# Patient Record
Sex: Male | Born: 1990 | Race: White | Hispanic: No | Marital: Single | Smoking: Never smoker
Health system: Southern US, Community
[De-identification: ages and names within clinical notes are randomized; demographics above are authoritative.]

---
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
## 2009-05-03 ENCOUNTER — Emergency Department: Payer: Self-pay | Admitting: Emergency Medicine

## 2010-04-05 ENCOUNTER — Emergency Department: Payer: Self-pay | Admitting: Emergency Medicine

## 2010-07-05 ENCOUNTER — Emergency Department: Payer: Self-pay | Admitting: Emergency Medicine

## 2014-05-19 ENCOUNTER — Emergency Department: Payer: Self-pay | Admitting: Emergency Medicine

## 2019-04-11 ENCOUNTER — Other Ambulatory Visit: Payer: Self-pay

## 2019-04-11 DIAGNOSIS — Z20822 Contact with and (suspected) exposure to covid-19: Secondary | ICD-10-CM

## 2019-04-13 LAB — NOVEL CORONAVIRUS, NAA: SARS-CoV-2, NAA: NOT DETECTED

## 2021-06-14 ENCOUNTER — Ambulatory Visit: Payer: Self-pay | Admitting: Family Medicine

## 2021-06-14 ENCOUNTER — Other Ambulatory Visit: Payer: Self-pay

## 2021-06-14 ENCOUNTER — Encounter: Payer: Self-pay | Admitting: Family Medicine

## 2021-06-14 DIAGNOSIS — Z113 Encounter for screening for infections with a predominantly sexual mode of transmission: Secondary | ICD-10-CM

## 2021-06-14 LAB — HM HIV SCREENING LAB: HM HIV Screening: NEGATIVE

## 2021-06-14 LAB — GRAM STAIN

## 2021-06-14 NOTE — Progress Notes (Signed)
[facility] Department STI clinic/screening visit  Subjective:  Chad Owen is a 31 y.o. male being seen today for an STI screening visit. The patient reports they do not have symptoms.    Patient has the following medical conditions:  There are no problems to display for this patient.    Chief Complaint  Patient presents with   SEXUALLY TRANSMITTED DISEASE    screening    HPI  Patient reports here for screening, deneis s/sx   Does the patient or their partner desires a pregnancy in the next year? No  Screening for MPX risk: Does the patient have an unexplained rash? No Is the patient MSM? No Does the patient endorse multiple sex partners or anonymous sex partners? No Did the patient have close or sexual contact with a person diagnosed with MPX? No Has the patient traveled outside the [place] where MPX is endemic? No Is there a high clinical suspicion for MPX-- evidenced by one of the following No  -Unlikely to be chickenpox  -Lymphadenopathy  -Rash that present in same phase of evolution on any given body part   See flowsheet for further details and programmatic requirements.    The following portions of the patient's history were reviewed and updated as appropriate: allergies, current medications, past medical history, past social history, past surgical history and problem list.  Objective:  There were no vitals filed for this visit.  Physical Exam Constitutional:      Appearance: Normal appearance.  HENT:     Head: Normocephalic.     Mouth/Throat:     Mouth: Mucous membranes are moist.     Pharynx: Oropharynx is clear. No oropharyngeal exudate.  Genitourinary:    Penis: Normal.      Testes: Normal.     Comments: No lice, nits, or pest, no lesions or odor discharge.  Denies pain or tenderness with paplation of testicles.  No lesions, ulcers or masses present.    Musculoskeletal:     Cervical back: Normal range of motion.  Lymphadenopathy:      Cervical: No cervical adenopathy.  Skin:    General: Skin is warm and dry.     Findings: No bruising, erythema, lesion or rash.  Neurological:     Mental Status: He is alert.  Psychiatric:        Mood and Affect: Mood normal.        Behavior: Behavior normal.      Assessment and Plan:  Chad Owen is a 31 y.o. male presenting to the [facility] Department for STI screening  1. Screening examination for venereal disease Patient does not have STI symptoms Patient accepted all screenings including  gream stain, oral, urethral GC and bloodwork for HIV/RPR.  Patient meets criteria for HepB screening? No. Ordered? No - does not meet criteria  Patient meets criteria for HepC screening? No. Ordered? No - does not meet criteria  Recommended condom use with all sex Discussed importance of condom use for STI prevent  Treat gram stain per standing order Discussed time line for [facility] results and that patient will be called with positive results and encouraged patient to call if he had not heard in 2 weeks Recommended returning for continued or worsening symptoms.   - Gram stain - Gonococcus culture - HIV [facility] - Syphilis Serology, [place] [facility] - Gonococcus culture  Return for as needed.  No future appointments.  Wendi Snipes, FNP

## 2021-06-14 NOTE — Progress Notes (Signed)
Pt here for STD screening.  Wet mount results reviewed, no treatment required, per SO.  Pt declined condoms. Brien Lowe M Makaylyn Sinyard, RN  

## 2021-06-18 LAB — GONOCOCCUS CULTURE
